# Patient Record
Sex: Male | Born: 2000 | Race: White | Hispanic: No | Marital: Single | Smoking: Never smoker
Health system: Southern US, Community
[De-identification: ages and names within clinical notes are randomized; demographics above are authoritative.]

## PROBLEM LIST (undated history)

## (undated) DIAGNOSIS — F32A Depression, unspecified: Secondary | ICD-10-CM

## (undated) DIAGNOSIS — F329 Major depressive disorder, single episode, unspecified: Secondary | ICD-10-CM

## (undated) DIAGNOSIS — F909 Attention-deficit hyperactivity disorder, unspecified type: Secondary | ICD-10-CM

## (undated) DIAGNOSIS — F988 Other specified behavioral and emotional disorders with onset usually occurring in childhood and adolescence: Secondary | ICD-10-CM

## (undated) DIAGNOSIS — R569 Unspecified convulsions: Secondary | ICD-10-CM

## (undated) HISTORY — DX: Depression, unspecified: F32.A

## (undated) HISTORY — DX: Major depressive disorder, single episode, unspecified: F32.9

## (undated) HISTORY — PX: APPENDECTOMY: SHX54

---
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
## 2019-01-10 ENCOUNTER — Telehealth: Payer: Self-pay | Admitting: Family Medicine

## 2019-01-10 NOTE — Telephone Encounter (Signed)
Tried to call pt to reschedule cancelled appt from 02/21/19 with Dr. Leretha Pol. Dr. Stann Mainland be out of the office that day.VM was full so no message was able to be left. No MyChart. When/If pt calls in, please reschedule with Dr. Leretha Pol at their convenience.

## 2019-02-21 ENCOUNTER — Ambulatory Visit: Payer: BLUE CROSS/BLUE SHIELD | Admitting: Family Medicine

## 2019-03-11 ENCOUNTER — Other Ambulatory Visit (INDEPENDENT_AMBULATORY_CARE_PROVIDER_SITE_OTHER): Payer: Self-pay | Admitting: Neurology

## 2019-03-16 ENCOUNTER — Telehealth (INDEPENDENT_AMBULATORY_CARE_PROVIDER_SITE_OTHER): Payer: Self-pay | Admitting: Neurology

## 2019-03-16 DIAGNOSIS — F329 Major depressive disorder, single episode, unspecified: Principal | ICD-10-CM

## 2019-03-16 DIAGNOSIS — R4589 Other symptoms and signs involving emotional state: Secondary | ICD-10-CM

## 2019-03-16 NOTE — Telephone Encounter (Signed)
°  Who's calling (name and relationship to patient) : Bonney Leitz - Father   Best contact number: [phone]   Provider they see: Dr. Devonne Doughty    Reason for call: Dad called stating that Alycia Rossetti needs a refill on the Lexapro. He has about one week left of his medication. Please advise     PRESCRIPTION REFILL ONLY  Name of prescription: Lexapro 20 MG   [facility]  [address]  [place], [place]

## 2019-03-16 NOTE — Telephone Encounter (Signed)
Please let father know that this prescription was not order by neurology, so I can not refill that.  Also he has not had any appointment since last year so he needs an appointment before refilling other meds.

## 2019-03-20 NOTE — Telephone Encounter (Signed)
Called patient's father and he confused seizure medication with lexapro. I scheduled Jeremiah Hart and appointment through WebEx tomorrow at 8:45am for a follow and medication refills.

## 2019-03-21 ENCOUNTER — Encounter (INDEPENDENT_AMBULATORY_CARE_PROVIDER_SITE_OTHER): Payer: Self-pay | Admitting: Neurology

## 2019-03-21 ENCOUNTER — Other Ambulatory Visit: Payer: Self-pay

## 2019-03-21 ENCOUNTER — Ambulatory Visit (INDEPENDENT_AMBULATORY_CARE_PROVIDER_SITE_OTHER): Payer: BLUE CROSS/BLUE SHIELD | Admitting: Neurology

## 2019-03-21 DIAGNOSIS — G40B09 Juvenile myoclonic epilepsy, not intractable, without status epilepticus: Secondary | ICD-10-CM | POA: Diagnosis not present

## 2019-03-21 DIAGNOSIS — R4589 Other symptoms and signs involving emotional state: Secondary | ICD-10-CM

## 2019-03-21 DIAGNOSIS — F329 Major depressive disorder, single episode, unspecified: Secondary | ICD-10-CM

## 2019-03-21 MED ORDER — DIVALPROEX SODIUM ER 500 MG PO TB24: 1000.0000 mg | ORAL_TABLET | Freq: Every day | ORAL | 6 refills | Status: DC

## 2019-03-21 NOTE — Progress Notes (Signed)
This is a Pediatric Specialist E-Visit follow up consult provided via WebEx Jeremiah Fabianyan Ellis Hart  consented to an E-Visit consult today.  Location of patient: Jeremiah Hart is at home Location of provider: Dr Devonne DoughtyNabizadeh is in office Patient was referred by Berline Lopes'Kelley, Brian, MD   The following participants were involved in this E-Visit:  Tresa EndoKelly, CMA Dr Angelia MouldNabizadeh Tu, patient Dad   Chief Complain/ Reason for E-Visit today: Seizures Total time on call: 25 minutes Follow up: 6 months  Patient: Jeremiah Hart MRN: [id number] Sex: male DOB: Oct 26, 2001  Provider: Keturah Shaverseza Jerric Oyen, MD [facility]  Note type: Routine return visit  Referral Source: Berline LopesBrian O'Kelley, MD [facility] and dad Chief Complaint: Seizures  History of Present Illness: Jeremiah Hart is a 18 y.o. male is here on WebEx for follow-up management of seizure disorder.  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 He was last seen in March 2019 and at that point he was recommended to slightly increase the dose of Depakote from 1000 mg to 1250 mg which he continued for a while but after that he continued with 1000 mg Depakote ER every night which is his current dose of medication. He has not had any clinical seizure activity for the past 2 to 3 years.  His last EEG was in April 2019 which was read by Dr. Sharene SkeansHickling and it was normal but initial EEG in 2017 was showing brief episodes of generalized discharges and also his prolonged ambulatory EEG was abnormal. He was also having some mood issues with depressed mood for which he was on SSRI for a while and currently he is on tapering dose of medication for the past few months. He was recommended to have some blood work done a few months ago but it has not been done in the last  blood work was from last year.  He usually sleeps well through the night and he has no other complaints or concerns at this time.  Review of Systems: 12 system review as per HPI, otherwise negative.  Past Medical History:  Diagnosis Date  . ADHD (attention deficit hyperactivity disorder)   . Attention deficit disorder (ADD)   . Depression   . Seizures (HCC)    Hospitalizations: No., Head Injury: No., Nervous System Infections: No., Immunizations up to date: Yes.     Surgical History Past Surgical History:  Procedure Laterality Date  . APPENDECTOMY    . APPENDECTOMY  10/09/2011   Procedure: APPENDECTOMY PEDIATRIC;  Surgeon: Judie PetitM. Leonia CoronaShuaib Farooqui, MD;  Location: [facility] OR;  Service: Pediatrics;  Laterality: N/A;    Family History family history includes Birth defects in his paternal grandfather; COPD in his paternal grandfather; Hypertension in his father and paternal grandfather; Migraines in his paternal aunt and sister; Vision loss in his paternal grandmother.   Social History Social History   Socioeconomic History  . Marital status: Single    Spouse name: Not on file  . Number of children: Not on file  . Years of education: Not on file  . Highest education level: Not on file  Occupational History  . Not on file  Social Needs  . Financial resource strain: Not on file  . Food insecurity:    Worry: Not on file    Inability: Not on file  . Transportation needs:  Medical: Not on file    Non-medical: Not on file  Tobacco Use  . Smoking status: Never Smoker  . Smokeless tobacco: Never Used  Substance and Sexual Activity  . Alcohol use: No  . Drug use: No  . Sexual activity: Never  Lifestyle  . Physical activity:    Days per week: Not on file    Minutes per session: Not on file  . Stress: Not on file  Relationships  . Social connections:    Talks on phone: Not on file    Gets together: Not on file    Attends religious service: Not on file    Active member of club  or organization: Not on file    Attends meetings of clubs or organizations: Not on file    Relationship status: Not on file  Other Topics Concern  . Not on file  Social History Narrative   Achillies works at [employer]. He lives at home with mom, dad and sister.      The medication list was reviewed and reconciled. All changes or newly prescribed medications were explained.  A complete medication list was provided to the patient/caregiver.  No Known Allergies  Physical Exam There were no vitals taken for this visit. His limited neurological exam on WebEx is normal.  He was awake and alert, follows instructions appropriately with normal comprehension and fluent speech.  He had normal cranial nerve exam.  He was able to walk normally without any coordination issues or difficulty with balance and with no tremor.  He had normal range of motion with no limitation of activity.  Assessment and Plan 1. Nonintractable juvenile myoclonic epilepsy without status epilepticus (HCC)   2. Depressed mood    This is a 18 year old male with diagnosis of generalized seizure disorder and possibly juvenile myoclonic epilepsy, currently on moderate dose of Depakote with no clinical seizure activity over the past 2 to 3 years and doing well otherwise.  He also has some depressed mood currently on low tapering dose of SSRI.  He has no findings on his limited neurological exam. Recommendations: Continue the same dose of Depakote at 1000 mg every night He needs to have some blood work including Depakote level over the next few weeks I will schedule him for an EEG with sleep deprivation to be done in August. I discussed with patient and his father that if he continues to be seizure-free for the next several months and his next EEG is normal, I may gradually taper and discontinue Depakote toward the end of the year. I would like to see him in 6 months for follow-up visit but I will call him with the EEG result and blood  work results.  He and his father understood and agreed with the plan.  Meds ordered this encounter  Medications  . divalproex (DEPAKOTE ER) 500 MG 24 hr tablet    Sig: Take 2 tablets (1,000 mg total) by mouth at bedtime.    Dispense:  60 tablet    Refill:  6   Orders Placed This Encounter  Procedures  . Valproic acid level  . CBC with Differential/Platelet  . Comprehensive metabolic panel  . VITAMIN D 25 Hydroxy (Vit-D Deficiency, Fractures)  . Child sleep deprived EEG    Standing Status:   Future    Standing Expiration Date:   03/20/2020

## 2019-03-21 NOTE — Patient Instructions (Signed)
Please continue the same dose of Depakote at 1000 mg every night You should have some blood work done over the next few weeks. I also schedule a sleep deprived EEG for August If you continue to be seizure-free for the next 6 months and next EEG is normal then we may consider gradually tapering and discontinuing your seizure medication toward the end of the year. Call at any time if there is any seizure activity. Please return in 6 months.

## 2019-04-04 ENCOUNTER — Other Ambulatory Visit: Payer: Self-pay

## 2019-04-04 ENCOUNTER — Ambulatory Visit (INDEPENDENT_AMBULATORY_CARE_PROVIDER_SITE_OTHER): Payer: BLUE CROSS/BLUE SHIELD | Admitting: Pediatrics

## 2019-04-04 ENCOUNTER — Telehealth (INDEPENDENT_AMBULATORY_CARE_PROVIDER_SITE_OTHER): Payer: Self-pay | Admitting: Neurology

## 2019-04-04 DIAGNOSIS — G40B09 Juvenile myoclonic epilepsy, not intractable, without status epilepticus: Secondary | ICD-10-CM

## 2019-04-04 NOTE — Procedures (Signed)
Patient:  Jeremiah Hart   Sex: male  DOB:  Jun 12, 2001  Date of study: 04/04/2019  Clinical history: This is a 18 year old male with diagnosis of generalized seizure disorder and possibly juvenile myoclonic epilepsy, currently on moderate dose of Depakote with no clinical seizure activity over the past 2 to 3 years. EEG was done to evaluate for epileptiform discharges.  Medication: Depakote  Procedure: The tracing was carried out on a 32 channel digital Cadwell recorder reformatted into 16 channel montages with 1 devoted to EKG.  The 10 /20 international system electrode placement was used. Recording was done during awake, drowsiness and sleep states. Recording time  44.5 Minutes.   Description of findings: Background rhythm consists of amplitude of 50  microvolt and frequency of 10hertz posterior dominant rhythm. There was normal anterior posterior gradient noted. Background was well organized, continuous and symmetric with no focal slowing. There was muscle artifact noted. During drowsiness and sleep there was gradual decrease in background frequency noted. During the early stages of sleep there were symmetrical sleep spindles and vertex sharp waves noted.  Hyperventilation resulted in slowing of the background activity. Photic simulation using stepwise increase in photic frequency resulted in bilateral symmetric driving response. Throughout the recording there were 2 brief episodes of generalized epileptiform activities in the form of spikes noted. One of the episodes was accompanied by a myoclonic jerk which woke him up from sleep. There were no transient rhythmic activities or electrographic seizures noted. One lead EKG rhythm strip revealed sinus rhythm at a rate of 80  bpm.  Impression: This EEG is slightly abnormal due to 2 brief discharges as described.  The findings consistent with generalized seizure disorder, associated with lower seizure threshold and require careful clinical  correlation.    Keturah Shavers, MD

## 2019-04-04 NOTE — Telephone Encounter (Signed)
Please call parents and let them know that the EEG is very slightly abnormal so we need to continue the same dose of medication for now. I will call them with the result of blood work.

## 2019-04-05 NOTE — Telephone Encounter (Signed)
Spoke to dad and informed him of the EEG results

## 2019-09-20 ENCOUNTER — Ambulatory Visit (INDEPENDENT_AMBULATORY_CARE_PROVIDER_SITE_OTHER): Payer: BLUE CROSS/BLUE SHIELD | Admitting: Neurology

## 2019-09-24 ENCOUNTER — Encounter (INDEPENDENT_AMBULATORY_CARE_PROVIDER_SITE_OTHER): Payer: Self-pay | Admitting: Neurology

## 2019-09-24 ENCOUNTER — Other Ambulatory Visit: Payer: Self-pay

## 2019-09-24 ENCOUNTER — Ambulatory Visit (INDEPENDENT_AMBULATORY_CARE_PROVIDER_SITE_OTHER): Payer: BLUE CROSS/BLUE SHIELD | Admitting: Neurology

## 2019-09-24 VITALS — BP 100/70 | HR 72 | Ht 63.98 in | Wt 147.4 lb

## 2019-09-24 DIAGNOSIS — G40B09 Juvenile myoclonic epilepsy, not intractable, without status epilepticus: Secondary | ICD-10-CM | POA: Diagnosis not present

## 2019-09-24 DIAGNOSIS — R569 Unspecified convulsions: Secondary | ICD-10-CM | POA: Diagnosis not present

## 2019-09-24 MED ORDER — DIVALPROEX SODIUM ER 500 MG PO TB24: 1000.0000 mg | ORAL_TABLET | Freq: Every day | ORAL | 6 refills | Status: DC

## 2019-09-24 NOTE — Progress Notes (Signed)
Patient: Jeremiah Hart MRN: [id number] Sex: male DOB: 2001/03/12  Provider: Keturah Shavers, MD Location of Care: [facility]  Note type: Routine return visit  Referral Source: Berline Lopes, MD History from: patient, [facility] chart and mom Chief Complaint: Seizures  History of Present Illness: Jeremiah Hart is a 18 y.o. male is here for follow-up management of seizure disorder.  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 Currently he is taking 1000 mg of Depakote ER with good seizure control and no clinical seizure activity over the past year.  He was last seen on 03/21/2019 and his last EEG was Apr 04, 2019 with just 2 brief episodes of generalized discharges. Over the past several months he has been doing well without having any clinical seizure activity as per patient and his mother.  He is usually sleeping well without any issues. Previously was taking SSRI for depressed mood but currently is not taking any other medication except for Depakote.  He was supposed to have some blood work done on his last visit which has not been done yet.  He and his mother do not have any other complaints or concerns at this time.  Review of Systems: Review of system as per HPI, otherwise negative.  Past Medical History:  Diagnosis Date  . ADHD (attention deficit hyperactivity disorder)   . Attention deficit disorder (ADD)   . Depression   . Seizures (HCC)    Hospitalizations: No., Head Injury: No., Nervous System Infections: No., Immunizations up to date: Yes.     Surgical History Past Surgical History:  Procedure Laterality Date  . APPENDECTOMY    . APPENDECTOMY  10/09/2011   Procedure: APPENDECTOMY PEDIATRIC;  Surgeon: Judie Petit. Leonia Corona, MD;  Location: [facility] OR;  Service: Pediatrics;  Laterality: N/A;    Family History family history includes  Birth defects in his paternal grandfather; COPD in his paternal grandfather; Hypertension in his father and paternal grandfather; Migraines in his paternal aunt and sister; Vision loss in his paternal grandmother.   Social History Social History   Socioeconomic History  . Marital status: Single    Spouse name: Not on file  . Number of children: Not on file  . Years of education: Not on file  . Highest education level: Not on file  Occupational History  . Not on file  Social Needs  . Financial resource strain: Not on file  . Food insecurity    Worry: Not on file    Inability: Not on file  . Transportation needs    Medical: Not on file    Non-medical: Not on file  Tobacco Use  . Smoking status: Never Smoker  . Smokeless tobacco: Never Used  Substance and Sexual Activity  . Alcohol use: No  . Drug use: No  . Sexual activity: Never  Lifestyle  . Physical activity    Days per week: Not on file    Minutes per session: Not on file  . Stress: Not on file  Relationships  . Social Musician on phone: Not on file    Gets together: Not on file    Attends religious service: Not on file    Active member of club or organization: Not on file    Attends meetings of clubs or organizations: Not on file    Relationship status: Not on file  Other Topics Concern  . Not on file  Social History Narrative   Thanh works at [employer]. He lives at home with mom, dad and sister.      No Known Allergies  Physical Exam BP 100/70   Pulse 72   Ht 5' 3.98" (1.625 m)   Wt 147 lb 6.4 oz (66.9 kg)   BMI 25.32 kg/m  Gen: Awake, alert, not in distress Skin: No rash, No neurocutaneous stigmata. HEENT: Normocephalic, no dysmorphic features, no conjunctival injection, nares patent, mucous membranes moist, oropharynx clear. Neck: Supple, no meningismus. No focal tenderness. Resp: Clear to auscultation bilaterally CV: Regular rate, normal S1/S2, no murmurs, no rubs Abd: BS present,  abdomen soft, non-tender, non-distended. No hepatosplenomegaly or mass Ext: Warm and well-perfused. No deformities, no muscle wasting, ROM full.  Neurological Examination: MS: Awake, alert, interactive. Normal eye contact, answered the questions appropriately, speech was fluent,  Normal comprehension.  Attention and concentration were normal. Cranial Nerves: Pupils were equal and reactive to light ( 5-82mm);  normal fundoscopic exam with sharp discs, visual field full with confrontation test; EOM normal, no nystagmus; no ptsosis, no double vision, intact facial sensation, face symmetric with full strength of facial muscles, hearing intact to finger rub bilaterally, palate elevation is symmetric, tongue protrusion is symmetric with full movement to both sides.  Sternocleidomastoid and trapezius are with normal strength. Tone-Normal Strength-Normal strength in all muscle groups DTRs-  Biceps Triceps Brachioradialis Patellar Ankle  R 2+ 2+ 2+ 2+ 2+  L 2+ 2+ 2+ 2+ 2+   Plantar responses flexor bilaterally, no clonus noted Sensation: Intact to light touch,  Romberg negative. Coordination: No dysmetria on FTN test. No difficulty with balance. Gait: Normal walk and run. Tandem gait was normal. Was able to perform toe walking and heel walking without difficulty.   Assessment and Plan 1. Nonintractable juvenile myoclonic epilepsy without status epilepticus ([place])    This is a 18 year old male with generalized seizure disorder and most likely juvenile myoclonic epilepsy, currently on fairly low to moderate dose of Depakote ER at 1000 mg every night, tolerating medication well with no side effects.  He has normal neurological exam with no recent clinical seizures. Recommend to continue the same dose of Depakote at 1000 mg every night. Recommend to perform blood work to check the Depakote level as well as CBC, CMP and TSH I do not think he needs follow-up EEG at this point but after his next visit I may  consider performing a routine EEG or a prolonged ambulatory EEG for evaluation of epileptiform discharges. I discussed with patient again regarding the seizure precautions and seizure triggers particularly adequate sleep and limited screen time. I would like to see him in 6 months for follow-up visit or sooner if he develops more seizure activity.  He and his mother understood and agreed with the plan.    Meds ordered this encounter  Medications  . divalproex (DEPAKOTE ER) 500 MG 24 hr tablet    Sig: Take 2 tablets (1,000 mg total) by mouth at bedtime.    Dispense:  60 tablet    Refill:  6   Orders Placed This Encounter  Procedures  . Valproic acid level  . CBC with Differential/Platelet  . Comprehensive metabolic panel  . TSH  . TSH  . Comprehensive metabolic panel  . CBC with Differential/Platelet  . Valproic acid level

## 2019-09-24 NOTE — Patient Instructions (Signed)
Continue the same dose of Depakote Perform blood work today Continue with adequate sleep and limited screen time Call my office if there is any seizure We will consider routine EEG or a prolonged overnight EEG after your next visit.   Return in 6 months for follow-up visit

## 2019-09-25 LAB — CBC WITH DIFFERENTIAL/PLATELET
Absolute Monocytes: 364 cells/uL (ref 200–950)
Basophils Absolute: 50 cells/uL (ref 0–200)
Basophils Relative: 0.9 %
Eosinophils Absolute: 11 cells/uL — ABNORMAL LOW (ref 15–500)
Eosinophils Relative: 0.2 %
HCT: 42.9 % (ref 38.5–50.0)
Hemoglobin: 14.8 g/dL (ref 13.2–17.1)
Lymphs Abs: 2307 cells/uL (ref 850–3900)
MCH: 30.3 pg (ref 27.0–33.0)
MCHC: 34.5 g/dL (ref 32.0–36.0)
MCV: 87.7 fL (ref 80.0–100.0)
MPV: 12.2 fL (ref 7.5–12.5)
Monocytes Relative: 6.5 %
Neutro Abs: 2867 cells/uL (ref 1500–7800)
Neutrophils Relative %: 51.2 %
Platelets: 195 10*3/uL (ref 140–400)
RBC: 4.89 10*6/uL (ref 4.20–5.80)
RDW: 12.5 % (ref 11.0–15.0)
Total Lymphocyte: 41.2 %
WBC: 5.6 10*3/uL (ref 3.8–10.8)

## 2019-09-25 LAB — COMPREHENSIVE METABOLIC PANEL
AG Ratio: 1.8 (calc) (ref 1.0–2.5)
ALT: 13 U/L (ref 8–46)
AST: 15 U/L (ref 12–32)
Albumin: 4.9 g/dL (ref 3.6–5.1)
Alkaline phosphatase (APISO): 84 U/L (ref 46–169)
BUN: 10 mg/dL (ref 7–20)
CO2: 23 mmol/L (ref 20–32)
Calcium: 9.9 mg/dL (ref 8.9–10.4)
Chloride: 106 mmol/L (ref 98–110)
Creat: 0.63 mg/dL (ref 0.60–1.26)
Globulin: 2.8 g/dL (calc) (ref 2.1–3.5)
Glucose, Bld: 78 mg/dL (ref 65–99)
Potassium: 4.6 mmol/L (ref 3.8–5.1)
Sodium: 141 mmol/L (ref 135–146)
Total Bilirubin: 0.3 mg/dL (ref 0.2–1.1)
Total Protein: 7.7 g/dL (ref 6.3–8.2)

## 2019-09-25 LAB — VALPROIC ACID LEVEL: Valproic Acid Lvl: 61 mg/L (ref 50.0–100.0)

## 2019-12-12 ENCOUNTER — Telehealth (INDEPENDENT_AMBULATORY_CARE_PROVIDER_SITE_OTHER): Payer: Self-pay | Admitting: Neurology

## 2019-12-12 NOTE — Telephone Encounter (Signed)
Dad came in to drop off insurance forms for Dr. Devonne Doughty to complete. Please call dad when done. Phone number [phone]

## 2020-01-16 DIAGNOSIS — H1031 Unspecified acute conjunctivitis, right eye: Secondary | ICD-10-CM | POA: Diagnosis not present

## 2020-01-16 DIAGNOSIS — H579 Unspecified disorder of eye and adnexa: Secondary | ICD-10-CM | POA: Diagnosis not present

## 2020-02-12 NOTE — Telephone Encounter (Signed)
No action needed

## 2020-06-14 ENCOUNTER — Other Ambulatory Visit (INDEPENDENT_AMBULATORY_CARE_PROVIDER_SITE_OTHER): Payer: Self-pay | Admitting: Neurology

## 2020-06-20 ENCOUNTER — Other Ambulatory Visit: Payer: Self-pay

## 2020-06-20 ENCOUNTER — Ambulatory Visit (INDEPENDENT_AMBULATORY_CARE_PROVIDER_SITE_OTHER): Payer: BC Managed Care – PPO | Admitting: Family Medicine

## 2020-06-20 ENCOUNTER — Encounter: Payer: Self-pay | Admitting: Family Medicine

## 2020-06-20 VITALS — BP 113/71 | HR 78 | Temp 98.1°F | Ht 63.98 in | Wt 138.6 lb

## 2020-06-20 DIAGNOSIS — R41840 Attention and concentration deficit: Secondary | ICD-10-CM

## 2020-06-20 DIAGNOSIS — G40B09 Juvenile myoclonic epilepsy, not intractable, without status epilepticus: Secondary | ICD-10-CM | POA: Diagnosis not present

## 2020-06-20 NOTE — Patient Instructions (Addendum)
[facility] [address] [place]  [phone]    If you have lab work done today you will be contacted with your lab results within the next 2 weeks.  If you have not heard from [place] then please contact us. The fastest way to get your results is to register for My Chart.   IF you received an x-ray today, you will receive an invoice from [facility]. Please contact [facility] at [phone] with questions or concerns regarding your invoice.   IF you received labwork today, you will receive an invoice from [facility]. Please contact [facility] at [phone] with questions or concerns regarding your invoice.   Our billing staff will not be able to assist you with questions regarding bills from these companies.  You will be contacted with the lab results as soon as they are available. The fastest way to get your results is to activate your My Chart account. Instructions are located on the last page of this paperwork. If you have not heard from [place] regarding the results in 2 weeks, please contact this office.

## 2020-06-20 NOTE — Progress Notes (Signed)
7/30/20214:02 PM  Jeremiah Hart 08/06/2001, 19 y.o., male [id number]  Chief Complaint  Patient presents with  . ADHD    after speaking with the therapist which is seen q 3-4 wks, told that it may be wise to get back on the concerta medication. Last tken in 2015 from 2009-2015. Has  taken the 17,27,36mg  of med tapering down to none.    HPI:   Patient is a 19 y.o. male with past medical history significant for depression, ADD, seziures who presents today wishing to restart ADD meds  Last OV Jan 2020 - weaning off lexapro  Has been working with therapist Lonell Face for about 6 months at [facility] Coping with increased anxiety secondary to untreated ADD Patient used to be on concerta in the past and did well on it Stopped around 2015 as felt he did not need it anymore Works full time at shop Continues to see peds neuro for seizures, last OV nov 2020, no changes, followup in 6 months  ASRS v1.1 done. Part A sometimes to Q1 and Q2., very often Q5 and Q6.   Depression screen PHQ 2/9 04/22/2017  Decreased Interest 2  Down, Depressed, Hopeless 3  PHQ - 2 Score 5  Altered sleeping 3  Tired, decreased energy 1  Change in appetite 0  Feeling bad or failure about yourself  1  Trouble concentrating 1  Moving slowly or fidgety/restless 0  Suicidal thoughts 0  PHQ-9 Score 11    Fall Risk  06/20/2020 11/27/2018  Falls in the past year? 0 0  Number falls in past yr: 0 -  Injury with Fall? 0 -     No Known Allergies  Prior to Admission medications   Medication Sig Start Date End Date Taking? Authorizing Provider  divalproex (DEPAKOTE ER) 500 MG 24 hr tablet TAKE 2 TABLETS (1,000 MG TOTAL) BY MOUTH AT BEDTIME. 06/16/20  Yes Keturah Shavers, MD    Past Medical History:  Diagnosis Date  . ADHD (attention deficit hyperactivity disorder)   . Attention deficit disorder (ADD)   . Depression   . Seizures (HCC)     Past Surgical History:  Procedure  Laterality Date  . APPENDECTOMY    . APPENDECTOMY  10/09/2011   Procedure: APPENDECTOMY PEDIATRIC;  Surgeon: Judie Petit. Leonia Corona, MD;  Location: [facility] OR;  Service: Pediatrics;  Laterality: N/A;    Social History   Tobacco Use  . Smoking status: Never Smoker  . Smokeless tobacco: Never Used  Substance Use Topics  . Alcohol use: No    Family History  Problem Relation Age of Onset  . Hypertension Father   . Vision loss Paternal Grandmother        Macular Degeneration  . Birth defects Paternal Grandfather        Missing some of fingers and toes  . COPD Paternal Grandfather   . Hypertension Paternal Grandfather   . Migraines Sister   . Migraines Paternal Aunt     ROS Per hpi  OBJECTIVE:  Today's Vitals   06/20/20 1538  BP: 113/71  Pulse: 78  Temp: 98.1 F (36.7 C)  SpO2: 97%  Weight: 138 lb 9.6 oz (62.9 kg)  Height: 5' 3.98" (1.625 m)   Body mass index is 23.81 kg/m.   Physical Exam Vitals and nursing note reviewed.  Constitutional:      Appearance: He is well-developed.  HENT:     Head: Normocephalic and atraumatic.  Eyes:  Conjunctiva/sclera: Conjunctivae normal.     Pupils: Pupils are equal, round, and reactive to light.  Cardiovascular:     Rate and Rhythm: Normal rate and regular rhythm.     Heart sounds: No murmur heard.  No friction rub. No gallop.   Pulmonary:     Effort: Pulmonary effort is normal.     Breath sounds: Normal breath sounds. No wheezing or rales.  Musculoskeletal:     Cervical back: Neck supple.     Right lower leg: No edema.     Left lower leg: No edema.  Skin:    General: Skin is warm and dry.  Neurological:     Mental Status: He is alert and oriented to person, place, and time.     No results found for this or any previous visit (from the past 24 hour(s)).  No results found.   ASSESSMENT and PLAN  1. Poor concentration - Ambulatory referral to Psychiatry - [facility] to help anxiety vs ADD,  (manage and treat if ADD)  2. Nonintractable juvenile myoclonic epilepsy without status epilepticus (HCC) Managed by peds endo  No follow-ups on file.    Myles Lipps, MD Primary Care at [facility] [address] [place], [place] [address] Ph.  [phone] Fax [phone]

## 2020-07-14 ENCOUNTER — Other Ambulatory Visit (INDEPENDENT_AMBULATORY_CARE_PROVIDER_SITE_OTHER): Payer: Self-pay | Admitting: Neurology

## 2020-07-26 ENCOUNTER — Other Ambulatory Visit (INDEPENDENT_AMBULATORY_CARE_PROVIDER_SITE_OTHER): Payer: Self-pay | Admitting: Neurology

## 2020-07-29 ENCOUNTER — Telehealth (INDEPENDENT_AMBULATORY_CARE_PROVIDER_SITE_OTHER): Payer: Self-pay | Admitting: Neurology

## 2020-07-29 NOTE — Telephone Encounter (Signed)
  Who's calling (name and relationship to patient) : Misty Stanley, mother (DPR on file)  Best contact number: [phone]  Provider they see: Nab  Reason for call:      PRESCRIPTION REFILL ONLY  Name of prescription: Depakote-patient is completely out. Does not have any for today.   Pharmacy: [facility]

## 2020-07-29 NOTE — Telephone Encounter (Signed)
Lvm for mom to let her know the rx had been sent in

## 2020-08-14 ENCOUNTER — Other Ambulatory Visit: Payer: Self-pay

## 2020-08-14 ENCOUNTER — Ambulatory Visit (INDEPENDENT_AMBULATORY_CARE_PROVIDER_SITE_OTHER): Payer: BC Managed Care – PPO | Admitting: Neurology

## 2020-08-14 ENCOUNTER — Encounter (INDEPENDENT_AMBULATORY_CARE_PROVIDER_SITE_OTHER): Payer: Self-pay | Admitting: Neurology

## 2020-08-14 VITALS — BP 110/72 | HR 70 | Ht 64.57 in | Wt 136.5 lb

## 2020-08-14 DIAGNOSIS — R4589 Other symptoms and signs involving emotional state: Secondary | ICD-10-CM | POA: Diagnosis not present

## 2020-08-14 DIAGNOSIS — G40B09 Juvenile myoclonic epilepsy, not intractable, without status epilepticus: Secondary | ICD-10-CM | POA: Diagnosis not present

## 2020-08-14 MED ORDER — DIVALPROEX SODIUM ER 500 MG PO TB24
1000.0000 mg | ORAL_TABLET | Freq: Every day | ORAL | 6 refills | Status: DC
Start: 2020-08-14 — End: 2020-09-17

## 2020-08-14 NOTE — Patient Instructions (Signed)
Continue the same dose of Depakote at 1000 mg nightly We will schedule for some blood work to be done today We will schedule for sleep deprived EEG for the next couple of weeks Continue with adequate sleep and limited screen time Return in 6 months for follow-up

## 2020-08-14 NOTE — Progress Notes (Signed)
Patient: Jeremiah Hart MRN: [id number] Sex: male DOB: 05-20-2001  Provider: Keturah Shavers, MD Location of Care: [facility]  Note type: Routine return visit  Referral Source: Koren Shiver, MD History from: patient, [facility] chart and mom Chief Complaint: epilepsy  History of Present Illness: Jeremiah Hart is a 19 y.o. male is here for follow-up management of seizure disorder. 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 He has been doing well without any seizure activity at least for more than 3 years and has been tolerating moderate dose of Depakote well with no side effects. He was having significant depressed mood and some behavioral issues for which he was on antidepressant medication for a while but currently is not on any medication. He also had history of ADHD for which he was on stimulant medication in the past but currently is not on any medication. Currently he is walking on a daily basis and he is doing well. He usually sleeps well without any difficulty. He drives car. He has been stable without any other complaints or concerns over the past several months and since his last visit in November 2020. His last EEG was in May 2020 with 2 brief generalized discharges. His last blood work was in 2019 with Depakote level of 79  Review of Systems: Review of system as per HPI, otherwise negative.  Past Medical History:  Diagnosis Date  . ADHD (attention deficit hyperactivity disorder)   . Attention deficit disorder (ADD)   . Depression   . Seizures (HCC)    Hospitalizations: No., Head Injury: No., Nervous System Infections: No., Immunizations up to date: Yes.     Surgical History Past Surgical History:  Procedure Laterality Date  . APPENDECTOMY    . APPENDECTOMY   10/09/2011   Procedure: APPENDECTOMY PEDIATRIC;  Surgeon: Judie Petit. Leonia Corona, MD;  Location: [facility] OR;  Service: Pediatrics;  Laterality: N/A;    Family History family history includes Birth defects in his paternal grandfather; COPD in his paternal grandfather; Hypertension in his father and paternal grandfather; Migraines in his paternal aunt and sister; Vision loss in his paternal grandmother.   Social History Social History   Socioeconomic History  . Marital status: Single    Spouse name: Not on file  . Number of children: Not on file  . Years of education: Not on file  . Highest education level: Not on file  Occupational History  . Not on file  Tobacco Use  . Smoking status: Never Smoker  . Smokeless tobacco: Never Used  Substance and Sexual Activity  . Alcohol use: No  . Drug use: No  . Sexual activity: Never  Other Topics Concern  . Not on file  Social History Narrative   Wlliam works at [employer]. He lives at home with mom, dad and sister.    Social Determinants of Health   Financial Resource Strain:   . Difficulty of Paying Living Expenses: Not on file  Food Insecurity:   . Worried About Programme researcher, broadcasting/film/video in the Last Year: Not on file  . Ran Out of Food in the Last Year: Not on file  Transportation Needs:   . Lack of Transportation (Medical): Not on file  . Lack of Transportation (Non-Medical): Not on file  Physical Activity:   . Days of Exercise  per Week: Not on file  . Minutes of Exercise per Session: Not on file  Stress:   . Feeling of Stress : Not on file  Social Connections:   . Frequency of Communication with Friends and Family: Not on file  . Frequency of Social Gatherings with Friends and Family: Not on file  . Attends Religious Services: Not on file  . Active Member of Clubs or Organizations: Not on file  . Attends Banker Meetings: Not on file  . Marital Status: Not on file     No Known Allergies  Physical Exam BP 110/72   Pulse  70   Ht 5' 4.57" (1.64 m)   Wt 136 lb 7.4 oz (61.9 kg)   BMI 23.01 kg/m  Gen: Awake, alert, not in distress Skin: No rash, No neurocutaneous stigmata. HEENT: Normocephalic, no dysmorphic features, no conjunctival injection, nares patent, mucous membranes moist, oropharynx clear. Neck: Supple, no meningismus. No focal tenderness. Resp: Clear to auscultation bilaterally CV: Regular rate, normal S1/S2, no murmurs, no rubs Abd: BS present, abdomen soft, non-tender, non-distended. No hepatosplenomegaly or mass Ext: Warm and well-perfused. No deformities, no muscle wasting, ROM full.  Neurological Examination: MS: Awake, alert, interactive. Normal eye contact, answered the questions appropriately, speech was fluent,  Normal comprehension.  Attention and concentration were normal. Cranial Nerves: Pupils were equal and reactive to light ( 5-63mm);  normal fundoscopic exam with sharp discs, visual field full with confrontation test; EOM normal, no nystagmus; no ptsosis, no double vision, intact facial sensation, face symmetric with full strength of facial muscles, hearing intact to finger rub bilaterally, palate elevation is symmetric, tongue protrusion is symmetric with full movement to both sides.  Sternocleidomastoid and trapezius are with normal strength. Tone-Normal Strength-Normal strength in all muscle groups DTRs-  Biceps Triceps Brachioradialis Patellar Ankle  R 2+ 2+ 2+ 2+ 2+  L 2+ 2+ 2+ 2+ 2+   Plantar responses flexor bilaterally, no clonus noted Sensation: Intact to light touch, temperature, vibration, Romberg negative. Coordination: No dysmetria on FTN test. No difficulty with balance. Gait: Normal walk and run. Tandem gait was normal. Was able to perform toe walking and heel walking without difficulty.  Assessment and Plan 1. Nonintractable juvenile myoclonic epilepsy without status epilepticus (HCC)   2. Depressed mood    This is a 19 year old male with diagnosis of juvenile  myoclonic epilepsy since 2017 currently on moderate dose of Depakote with good seizure control and no clinical seizure activity for more than 3 years although his last EEG in 2020 showed brief generalized discharges. He has no focal findings on his neurological examination and has no other issues and has not been on any other medication. Recommendations: Continue the same dose of Depakote at 1000 mg daily Schedule for a sleep deprived EEG We will perform blood work to check Depakote level as well as CBC and CMP If his next EEG is normal and he continues to be seizure-free then we may try gradually taper and discontinue medication toward the end of next year. He will continue with adequate sleep and limited screen time. Return in 6 months for follow-up visit or sooner if he develops seizure activity. He and his mother understood and agreed with the plan.  Meds ordered this encounter  Medications  . divalproex (DEPAKOTE ER) 500 MG 24 hr tablet    Sig: Take 2 tablets (1,000 mg total) by mouth at bedtime.    Dispense:  60 tablet    Refill:  6  Orders Placed This Encounter  Procedures  . CBC with Differential/Platelet  . Comprehensive metabolic panel  . Valproic acid level  . Child sleep deprived EEG    Standing Status:   Future    Standing Expiration Date:   08/14/2021

## 2020-08-15 ENCOUNTER — Telehealth (INDEPENDENT_AMBULATORY_CARE_PROVIDER_SITE_OTHER): Payer: Self-pay | Admitting: Neurology

## 2020-08-15 LAB — CBC WITH DIFFERENTIAL/PLATELET
Absolute Monocytes: 371 cells/uL (ref 200–950)
Basophils Absolute: 63 cells/uL (ref 0–200)
Basophils Relative: 1.1 %
Eosinophils Absolute: 68 cells/uL (ref 15–500)
Eosinophils Relative: 1.2 %
HCT: 46.6 % (ref 38.5–50.0)
Hemoglobin: 15.9 g/dL (ref 13.2–17.1)
Lymphs Abs: 2793 cells/uL (ref 850–3900)
MCH: 30.8 pg (ref 27.0–33.0)
MCHC: 34.1 g/dL (ref 32.0–36.0)
MCV: 90.3 fL (ref 80.0–100.0)
MPV: 12 fL (ref 7.5–12.5)
Monocytes Relative: 6.5 %
Neutro Abs: 2405 cells/uL (ref 1500–7800)
Neutrophils Relative %: 42.2 %
Platelets: 215 10*3/uL (ref 140–400)
RBC: 5.16 10*6/uL (ref 4.20–5.80)
RDW: 12.2 % (ref 11.0–15.0)
Total Lymphocyte: 49 %
WBC: 5.7 10*3/uL (ref 3.8–10.8)

## 2020-08-15 LAB — COMPREHENSIVE METABOLIC PANEL
AG Ratio: 1.6 (calc) (ref 1.0–2.5)
ALT: 12 U/L (ref 9–46)
AST: 13 U/L (ref 10–40)
Albumin: 4.8 g/dL (ref 3.6–5.1)
Alkaline phosphatase (APISO): 68 U/L (ref 36–130)
BUN: 13 mg/dL (ref 7–25)
CO2: 29 mmol/L (ref 20–32)
Calcium: 10.1 mg/dL (ref 8.6–10.3)
Chloride: 102 mmol/L (ref 98–110)
Creat: 0.72 mg/dL (ref 0.60–1.35)
Globulin: 3 g/dL (calc) (ref 1.9–3.7)
Glucose, Bld: 78 mg/dL (ref 65–99)
Potassium: 5 mmol/L (ref 3.5–5.3)
Sodium: 138 mmol/L (ref 135–146)
Total Bilirubin: 0.4 mg/dL (ref 0.2–1.2)
Total Protein: 7.8 g/dL (ref 6.1–8.1)

## 2020-08-15 LAB — VALPROIC ACID LEVEL: Valproic Acid Lvl: 87.9 mg/L (ref 50.0–100.0)

## 2020-08-15 NOTE — Telephone Encounter (Signed)
Spoke to Burnham and let him know about the blood work

## 2020-08-15 NOTE — Telephone Encounter (Signed)
I reviewed the blood work with normal CBC and CMP and Depakote level of 87.  Tresa Endo, Please call mother and let her know that the blood works were normal and he needs to continue medication as it is for now.

## 2020-09-05 ENCOUNTER — Other Ambulatory Visit (INDEPENDENT_AMBULATORY_CARE_PROVIDER_SITE_OTHER): Payer: BC Managed Care – PPO

## 2020-09-08 DIAGNOSIS — R41840 Attention and concentration deficit: Secondary | ICD-10-CM | POA: Diagnosis not present

## 2020-09-16 ENCOUNTER — Other Ambulatory Visit (INDEPENDENT_AMBULATORY_CARE_PROVIDER_SITE_OTHER): Payer: Self-pay | Admitting: Neurology

## 2020-09-16 ENCOUNTER — Other Ambulatory Visit (INDEPENDENT_AMBULATORY_CARE_PROVIDER_SITE_OTHER): Payer: BC Managed Care – PPO

## 2020-09-16 NOTE — Telephone Encounter (Signed)
Approval for 90 day rx 

## 2020-09-30 ENCOUNTER — Other Ambulatory Visit (INDEPENDENT_AMBULATORY_CARE_PROVIDER_SITE_OTHER): Payer: BC Managed Care – PPO

## 2020-10-28 ENCOUNTER — Other Ambulatory Visit: Payer: Self-pay

## 2020-10-28 ENCOUNTER — Ambulatory Visit (INDEPENDENT_AMBULATORY_CARE_PROVIDER_SITE_OTHER): Payer: BC Managed Care – PPO | Admitting: Neurology

## 2020-10-28 DIAGNOSIS — G40B09 Juvenile myoclonic epilepsy, not intractable, without status epilepticus: Secondary | ICD-10-CM | POA: Diagnosis not present

## 2020-10-28 NOTE — Progress Notes (Signed)
Sleep deprived EEG completed; result pending.

## 2020-10-29 NOTE — Procedures (Signed)
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  This is a follow-up EEG for evaluation of epileptiform discharges.  Medication: Depakote             Procedure: The tracing was carried out on a 32 channel digital Cadwell recorder reformatted into 16 channel montages with 1 devoted to EKG.  The 10 /20 international system electrode placement was used. Recording was done during awake, drowsiness and sleep states. Recording time 43 minutes.   Description of findings: Background rhythm consists of amplitude of 50 microvolt and frequency of 10-11 hertz posterior dominant rhythm. There was normal anterior posterior gradient noted. Background was well organized, continuous and symmetric with no focal slowing. There was muscle artifact noted. During drowsiness and sleep there was gradual decrease in background frequency noted. During the early stages of sleep there were symmetrical sleep spindles and vertex sharp waves as well as occasional K complexes noted.  Hyperventilation resulted in slowing of the background activity. Photic stimulation using stepwise increase in photic frequency resulted in bilateral symmetric driving response. Throughout the recording there were just a few brief generalized discharges in the form of spikes and sharps noted toward the end of the recording.  There were no transient rhythmic activities or electrographic seizures noted. One lead EKG rhythm strip revealed sinus rhythm at a rate of 60 bpm.  Impression: This EEG is slightly abnormal due to a few brief generalized discharges as described. The findings are consistent with generalized seizure disorder, associated with lower seizure threshold and require careful clinical  correlation.    Keturah Shavers, MD

## 2020-11-07 ENCOUNTER — Telehealth (INDEPENDENT_AMBULATORY_CARE_PROVIDER_SITE_OTHER): Payer: Self-pay | Admitting: Neurology

## 2020-11-07 NOTE — Telephone Encounter (Signed)
I called patient and told him that the EEG shows a slight abnormality with occasional brief generalized discharges.  He will continue the same dose of seizure medication and I will see him in a few months at his next visit.

## 2020-11-07 NOTE — Telephone Encounter (Signed)
Please advise 

## 2020-11-07 NOTE — Telephone Encounter (Signed)
  Who's calling (name and relationship to patient) : Lanora Manis (mom - on DPR)  Best contact number: [phone]  Provider they see: Dr. Devonne Doughty  Reason for call: Requests call back with results of recent EEG.    PRESCRIPTION REFILL ONLY  Name of prescription:  Pharmacy:

## 2021-02-11 ENCOUNTER — Encounter (INDEPENDENT_AMBULATORY_CARE_PROVIDER_SITE_OTHER): Payer: Self-pay | Admitting: Neurology

## 2021-02-11 ENCOUNTER — Other Ambulatory Visit: Payer: Self-pay

## 2021-02-11 ENCOUNTER — Ambulatory Visit (INDEPENDENT_AMBULATORY_CARE_PROVIDER_SITE_OTHER): Payer: BC Managed Care – PPO | Admitting: Neurology

## 2021-02-11 VITALS — BP 110/76 | HR 74 | Ht 64.37 in | Wt 135.6 lb

## 2021-02-11 DIAGNOSIS — R4589 Other symptoms and signs involving emotional state: Secondary | ICD-10-CM | POA: Diagnosis not present

## 2021-02-11 DIAGNOSIS — G40B09 Juvenile myoclonic epilepsy, not intractable, without status epilepticus: Secondary | ICD-10-CM

## 2021-02-11 MED ORDER — DIVALPROEX SODIUM ER 500 MG PO TB24
1000.0000 mg | ORAL_TABLET | Freq: Every day | ORAL | 2 refills | Status: DC
Start: 2021-02-11 — End: 2021-12-04

## 2021-02-11 NOTE — Patient Instructions (Signed)
Continue the same dose of Depakote for now Will schedule for EEG with the next visit.  Return in 6 months for a FU visit

## 2021-02-11 NOTE — Progress Notes (Signed)
Patient: Jeremiah Hart MRN: [id number] Sex: male DOB: 10/05/2001  Provider: Keturah Shavers, MD Location of Care: [facility]  Note type: Routine return visit  Referral Source: Silvano Bilis, M History from: patient, [facility] chart and mom Chief Complaint: epilepsy  History of Present Illness: Jeremiah Hart is a 20 y.o. male is here for follow-up management of seizure disorder.  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 He has been on fairly low to moderate dose of Depakote over the past few years with no clinical seizure activity since 2018 and has been tolerating medication well with no side effects. His last EEGs in May 2020 and in December 2021 were slightly abnormal with occasional brief generalized discharges. He usually sleeps well without any difficulty and with no awakening.  He is doing significantly better in terms of mood and behavior.  Currently is not taking any other medication for anxiety and depression. Overall he thinks that he is doing well and he and his mother do not have any other complaints or concerns at this time.  Review of Systems: Review of system as per HPI, otherwise negative.  Past Medical History:  Diagnosis Date  . ADHD (attention deficit hyperactivity disorder)   . Attention deficit disorder (ADD)   . Depression   . Seizures (HCC)    Hospitalizations: No., Head Injury: No., Nervous System Infections: No., Immunizations up to date: Yes.    Surgical History Past Surgical History:  Procedure Laterality Date  . APPENDECTOMY    . APPENDECTOMY  10/09/2011   Procedure: APPENDECTOMY PEDIATRIC;  Surgeon: Judie Petit. Leonia Corona, MD;  Location: [facility] OR;  Service: Pediatrics;  Laterality: N/A;    Family History family history includes Birth defects in his paternal grandfather; COPD  in his paternal grandfather; Hypertension in his father and paternal grandfather; Migraines in his paternal aunt and sister; Vision loss in his paternal grandmother.   Social History Social History   Socioeconomic History  . Marital status: Single    Spouse name: Not on file  . Number of children: Not on file  . Years of education: Not on file  . Highest education level: Not on file  Occupational History  . Not on file  Tobacco Use  . Smoking status: Never Smoker  . Smokeless tobacco: Never Used  Substance and Sexual Activity  . Alcohol use: No  . Drug use: No  . Sexual activity: Never  Other Topics Concern  . Not on file  Social History Narrative   Usama works at [employer]. He lives at home with mom, dad and sister.    Social Determinants of Health   Financial Resource Strain: Not on file  Food Insecurity: Not on file  Transportation Needs: Not on file  Physical Activity: Not on file  Stress: Not on file  Social Connections: Not on file     No Known Allergies  Physical Exam BP 110/76   Pulse 74   Ht 5' 4.37" (1.635 m)   Wt 135 lb 9.3 oz (61.5 kg)   BMI 23.01 kg/m  Gen: Awake, alert, not in distress Skin: No rash, No neurocutaneous stigmata. HEENT: Normocephalic, no dysmorphic features, no conjunctival injection, nares patent, mucous membranes moist, oropharynx clear. Neck: Supple, no meningismus. No focal tenderness. Resp: Clear to auscultation bilaterally CV: Regular rate, normal S1/S2, no murmurs, no  rubs Abd: BS present, abdomen soft, non-tender, non-distended. No hepatosplenomegaly or mass Ext: Warm and well-perfused. No deformities, no muscle wasting, ROM full.  Neurological Examination: MS: Awake, alert, interactive. Normal eye contact, answered the questions appropriately, speech was fluent,  Normal comprehension.  Attention and concentration were normal. Cranial Nerves: Pupils were equal and reactive to light ( 5-67mm);  normal fundoscopic exam with  sharp discs, visual field full with confrontation test; EOM normal, no nystagmus; no ptsosis, no double vision, intact facial sensation, face symmetric with full strength of facial muscles, hearing intact to finger rub bilaterally, palate elevation is symmetric, tongue protrusion is symmetric with full movement to both sides.  Sternocleidomastoid and trapezius are with normal strength. Tone-Normal Strength-Normal strength in all muscle groups DTRs-  Biceps Triceps Brachioradialis Patellar Ankle  R 2+ 2+ 2+ 2+ 2+  L 2+ 2+ 2+ 2+ 2+   Plantar responses flexor bilaterally, no clonus noted Sensation: Intact to light touch, temperature, vibration, Romberg negative. Coordination: No dysmetria on FTN test. No difficulty with balance. Gait: Normal walk and run. Tandem gait was normal. Was able to perform toe walking and heel walking without difficulty.   Assessment and Plan 1. Nonintractable juvenile myoclonic epilepsy without status epilepticus (HCC)   2. Depressed mood    This is an almost 20 year old male with diagnosis of juvenile myoclonic epilepsy since 2017, currently on Depakote with good seizure control and no clinical seizure activity over the past 4 years although his EEG is still showing occasional brief generalized discharges.  He has no focal findings on his neurological examination at this time. I discussed with patient and his mother that there is genetic testing and epilepsy panel available that could be done to evaluate if there would be any genetic basis for his seizure and he agreed to do that. I also recommend to schedule for a follow-up EEG with his next appointment in about 6 months. I will continue the same dose of Depakote at 1000 mg daily every night He needs to continue with adequate sleep and limiting screen time and avoid bright sunlight. I would like to see him in 6 months for follow-up visit or sooner if he develops any seizure activity.  He and his mother understood and  agreed with the plan.  Meds ordered this encounter  Medications  . divalproex (DEPAKOTE ER) 500 MG 24 hr tablet    Sig: Take 2 tablets (1,000 mg total) by mouth at bedtime.    Dispense:  180 tablet    Refill:  2   Orders Placed This Encounter  Procedures  . Miscellaneous Genetic Test    Epilepsy panel  . Child sleep deprived EEG    Standing Status:   Future    Standing Expiration Date:   02/11/2022    Scheduling Instructions:     To be done in 6 months at the same time with the next visit.

## 2021-03-26 ENCOUNTER — Encounter (INDEPENDENT_AMBULATORY_CARE_PROVIDER_SITE_OTHER): Payer: Self-pay

## 2021-07-24 ENCOUNTER — Ambulatory Visit (INDEPENDENT_AMBULATORY_CARE_PROVIDER_SITE_OTHER): Payer: BC Managed Care – PPO | Admitting: Neurology

## 2021-07-24 ENCOUNTER — Other Ambulatory Visit: Payer: Self-pay

## 2021-07-24 DIAGNOSIS — G40B09 Juvenile myoclonic epilepsy, not intractable, without status epilepticus: Secondary | ICD-10-CM

## 2021-07-24 NOTE — Progress Notes (Signed)
OP adult sleep deprived EEG completed at [facility] office, results pending.

## 2021-07-29 NOTE — Procedures (Signed)
Patient:  Jeremiah Hart   Sex: male  DOB:  [birth year]  Date of study: 07/24/2021                Clinical history: This is a 20 year old male with diagnosis of juvenile myoclonic epilepsy since 2017 with no clinical seizure over the past 4 years.  His last EEG in 2020 was slightly abnormal with brief generalized discharges.  This is a follow-up EEG for evaluation of epileptiform discharges.  Medication:     Depakote           Procedure: The tracing was carried out on a 32 channel digital Cadwell recorder reformatted into 16 channel montages with 1 devoted to EKG.  The 10 /20 international system electrode placement was used. Recording was done during awake, drowsiness and sleep states. Recording time 43 minutes.   Description of findings: Background rhythm consists of amplitude of 45 microvolt and frequency of 10 hertz posterior dominant rhythm. There was normal anterior posterior gradient noted. Background was well organized, continuous and symmetric with no focal slowing. There was muscle artifact noted. During drowsiness and sleep there was gradual decrease in background frequency noted. During the early stages of sleep there were symmetrical sleep spindles and vertex sharp waves noted.  Hyperventilation resulted in slowing of the background activity. Photic stimulation using stepwise increase in photic frequency resulted in bilateral symmetric driving response. Throughout the recording there were occasional single sharply contoured waves noted in the frontal or central area or occasionally more generalized.  There were no transient rhythmic activities or electrographic seizures noted. One lead EKG rhythm strip revealed sinus rhythm at a rate of 65 bpm.  Impression: This EEG is slightly abnormal due to occasional sharply contoured waves as described. The findings are consistent with mild cortical irritability and slight increased epileptic potential, associated with lower seizure threshold and  require careful clinical correlation.    Keturah Shavers, MD

## 2021-12-04 ENCOUNTER — Other Ambulatory Visit (INDEPENDENT_AMBULATORY_CARE_PROVIDER_SITE_OTHER): Payer: Self-pay | Admitting: Neurology

## 2021-12-11 ENCOUNTER — Other Ambulatory Visit: Payer: Self-pay

## 2021-12-11 ENCOUNTER — Encounter (INDEPENDENT_AMBULATORY_CARE_PROVIDER_SITE_OTHER): Payer: Self-pay | Admitting: Neurology

## 2021-12-11 ENCOUNTER — Ambulatory Visit (INDEPENDENT_AMBULATORY_CARE_PROVIDER_SITE_OTHER): Payer: BC Managed Care – PPO | Admitting: Neurology

## 2021-12-11 VITALS — BP 112/80 | HR 68 | Ht 64.57 in | Wt 137.8 lb

## 2021-12-11 DIAGNOSIS — G40B09 Juvenile myoclonic epilepsy, not intractable, without status epilepticus: Secondary | ICD-10-CM | POA: Diagnosis not present

## 2021-12-11 DIAGNOSIS — R4589 Other symptoms and signs involving emotional state: Secondary | ICD-10-CM | POA: Diagnosis not present

## 2021-12-11 MED ORDER — DIVALPROEX SODIUM ER 500 MG PO TB24: 1000.0000 mg | ORAL_TABLET | Freq: Every day | ORAL | 10 refills | Status: DC

## 2021-12-11 NOTE — Patient Instructions (Signed)
Continue the same dose of Depakote at 1000 mg nightly Continue with adequate sleep and limiting screen time Call my office if there is any seizure activity We will schedule for a follow-up EEG at the same time with the next appointment We will also schedule for blood work to be done over the next couple of months Return in 10 months for follow-up visit

## 2021-12-11 NOTE — Progress Notes (Signed)
Patient: Jeremiah Hart MRN: [id number] Sex: male DOB: [birth year]  Provider: Keturah Shavers, MD Location of Care: [facility]  Note type: Routine return visit  Referral Source: Silvano Bilis, MD History from: mother, patient, and [facility] chart Chief Complaint: Follow Up Epilepsy  History of Present Illness: Jeremiah Hart is a 21 y.o. male is here for follow-up management of seizure disorder.  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 He has not had any clinical seizure activity since 2018 but his EEG is a still showing occasional brief generalized discharges.  His last EEG which was done in September 2022 showed occasional sharply contoured waves, some generalized and some in frontal and central area. He was last seen in March 2022 and since then he has been doing very well without having any clinical seizure activity and has been taking his medication regularly without any missing doses.  Currently he is taking Depakote 1000 mg every night.  He has not had any blood work for more than a year. He has been seen by his primary care physician and recently started on Lexapro to help with his mood issues.    Review of Systems: Review of system as per HPI, otherwise negative.  Past Medical History:  Diagnosis Date   ADHD (attention deficit hyperactivity disorder)    Attention deficit disorder (ADD)    Depression    Seizures (HCC)    Hospitalizations: No., Head Injury: No., Nervous System Infections: No., Immunizations up to date: Yes.     Surgical History Past Surgical History:  Procedure Laterality Date   APPENDECTOMY     APPENDECTOMY  10/09/2011   Procedure: APPENDECTOMY PEDIATRIC;  Surgeon: Judie Petit. Leonia Corona, MD;  Location: [facility];  Service: Pediatrics;  Laterality: N/A;    Family History family history  includes Birth defects in his paternal grandfather; COPD in his paternal grandfather; Hypertension in his father and paternal grandfather; Migraines in his paternal aunt and sister; Vision loss in his paternal grandmother.  Social History Social History   Socioeconomic History   Marital status: Single    Spouse name: Not on file   Number of children: Not on file   Years of education: Not on file   Highest education level: Not on file  Occupational History   Not on file  Tobacco Use   Smoking status: Never    Passive exposure: Never   Smokeless tobacco: Never  Substance and Sexual Activity   Alcohol use: No   Drug use: No   Sexual activity: Never  Other Topics Concern   Not on file  Social History Narrative   Jatavian works at [employer]. He lives at home with mom, dad and sister.    Social Determinants of Health   Financial Resource Strain: Not on file  Food Insecurity: Not on file  Transportation Needs: Not on file  Physical Activity: Not on file  Stress: Not on file  Social Connections: Not on file     No Known Allergies  Physical Exam BP 112/80    Pulse 68    Ht 5' 4.57" (1.64 m)    Wt 137 lb 12.6 oz (62.5 kg)    HC 22.05" (56 cm)    BMI 23.24 kg/m  Gen: Awake, alert, not in distress Skin: No rash, No neurocutaneous stigmata. HEENT: Normocephalic, no dysmorphic features,  no conjunctival injection, nares patent, mucous membranes moist, oropharynx clear. Neck: Supple, no meningismus. No focal tenderness. Resp: Clear to auscultation bilaterally CV: Regular rate, normal S1/S2, no murmurs, no rubs Abd: BS present, abdomen soft, non-tender, non-distended. No hepatosplenomegaly or mass Ext: Warm and well-perfused. No deformities, no muscle wasting, ROM full.  Neurological Examination: MS: Awake, alert, interactive. Normal eye contact, answered the questions appropriately, speech was fluent,  Normal comprehension.  Attention and concentration were normal. Cranial  Nerves: Pupils were equal and reactive to light ( 5-24mm);  normal fundoscopic exam with sharp discs, visual field full with confrontation test; EOM normal, no nystagmus; no ptsosis, no double vision, intact facial sensation, face symmetric with full strength of facial muscles, hearing intact to finger rub bilaterally, palate elevation is symmetric, tongue protrusion is symmetric with full movement to both sides.  Sternocleidomastoid and trapezius are with normal strength. Tone-Normal Strength-Normal strength in all muscle groups DTRs-  Biceps Triceps Brachioradialis Patellar Ankle  R 2+ 2+ 2+ 2+ 2+  L 2+ 2+ 2+ 2+ 2+   Plantar responses flexor bilaterally, no clonus noted Sensation: Intact to light touch, temperature, vibration, Romberg negative. Coordination: No dysmetria on FTN test. No difficulty with balance. Gait: Normal walk and run. Tandem gait was normal. Was able to perform toe walking and heel walking without difficulty.   Assessment and Plan 1. Nonintractable juvenile myoclonic epilepsy without status epilepticus (HCC)   2. 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  He has no focal findings on his neurological examination.  He does have some depression for which he is on Lexapro and doing better.  His last EEG a few months ago is still showing some abnormality. Recommend to continue the same dose of Depakote at 1000 mg daily We will schedule for blood work to check the trough level of Depakote. He will continue with adequate sleep and limiting screen time He needs to use sunglasses during driving to avoid bright sunlight We will schedule for a follow-up sleep deprived EEG at the same time with the next appointment He or his parents will call my office  at any time if there are more seizure activity We again discussed regarding further work-up such as genetic testing or brain MRI but most likely they would not give [place] any findings to change our treatment plan. I would like to see him in 10 months for follow-up visit and based on his clinical response and EEG may adjust the dose of medication.  He and his mother understood and agreed with the plan.   Meds ordered this encounter  Medications   divalproex (DEPAKOTE ER) 500 MG 24 hr tablet    Sig: Take 2 tablets (1,000 mg total) by mouth at bedtime.    Dispense:  60 tablet    Refill:  10   Orders Placed This Encounter  Procedures   Valproic acid level   CBC with Differential/Platelet   Comprehensive metabolic panel   Amylase   Child sleep deprived EEG    Standing Status:   Future    Standing Expiration Date:   12/11/2022    Scheduling Instructions:     Schedule EEG at the same time with the next appointment in 10 months    Order Specific Question:   Where  should this test be performed?    Answer:   [facility]

## 2022-12-28 ENCOUNTER — Other Ambulatory Visit (INDEPENDENT_AMBULATORY_CARE_PROVIDER_SITE_OTHER): Payer: Self-pay | Admitting: Neurology

## 2022-12-29 NOTE — Telephone Encounter (Signed)
Last Encounter: 12-12-2031 with Plan to return in 42mos (with labs and EEG). No appointment since.  No upcoming appt.  Last Rx: 12-11-2021  B. Roten CMA

## 2023-01-20 ENCOUNTER — Other Ambulatory Visit (INDEPENDENT_AMBULATORY_CARE_PROVIDER_SITE_OTHER): Payer: Self-pay | Admitting: Neurology

## 2023-08-30 ENCOUNTER — Other Ambulatory Visit (INDEPENDENT_AMBULATORY_CARE_PROVIDER_SITE_OTHER): Payer: Self-pay | Admitting: Neurology

## 2023-09-07 ENCOUNTER — Other Ambulatory Visit (INDEPENDENT_AMBULATORY_CARE_PROVIDER_SITE_OTHER): Payer: Self-pay

## 2023-09-07 DIAGNOSIS — R569 Unspecified convulsions: Secondary | ICD-10-CM

## 2023-09-22 ENCOUNTER — Telehealth (INDEPENDENT_AMBULATORY_CARE_PROVIDER_SITE_OTHER): Payer: Self-pay | Admitting: Neurology

## 2023-09-22 NOTE — Telephone Encounter (Signed)
Who's calling (name and relationship to patient) : Jeremiah Hart; mom   Best contact number: [phone]  Provider they see: Dr. Merri Brunette  Reason for call: Mom has called in wanting to know if lab work would need to be done. She stated that its been 2 yrs since he had lab work done and has been on the same medication. She also wanted to know if Jeremiah Hart would still be seen after he turns 23 or if would need adult care. She stated [name] can be contacted at [phone]  FYI: no DPR on file.    Call ID:      PRESCRIPTION REFILL ONLY  Name of prescription:  Pharmacy:

## 2023-11-08 ENCOUNTER — Ambulatory Visit (INDEPENDENT_AMBULATORY_CARE_PROVIDER_SITE_OTHER): Payer: BC Managed Care – PPO | Admitting: Neurology

## 2023-11-08 ENCOUNTER — Encounter (INDEPENDENT_AMBULATORY_CARE_PROVIDER_SITE_OTHER): Payer: Self-pay | Admitting: Neurology

## 2023-11-08 VITALS — BP 112/60 | HR 64 | Ht 64.33 in | Wt 154.5 lb

## 2023-11-08 DIAGNOSIS — R569 Unspecified convulsions: Secondary | ICD-10-CM

## 2023-11-08 DIAGNOSIS — G40B09 Juvenile myoclonic epilepsy, not intractable, without status epilepticus: Secondary | ICD-10-CM

## 2023-11-08 DIAGNOSIS — R4589 Other symptoms and signs involving emotional state: Secondary | ICD-10-CM

## 2023-11-08 MED ORDER — DIVALPROEX SODIUM ER 500 MG PO TB24: 1000.0000 mg | ORAL_TABLET | Freq: Every day | ORAL | 2 refills | Status: DC

## 2023-11-08 NOTE — Procedures (Signed)
Patient:  Jeremiah Hart   Sex: male  DOB:  02/14/2001  Date of study:    11/08/2023              Clinical history: This is a 22 year old male with diagnosis of generalized seizure disorder and most likely JME, on medication with good seizure control.  This is a follow-up EEG for evaluation of epileptiform discharges.  Medication:   Depakote            Procedure: The tracing was carried out on a 32 channel digital Cadwell recorder reformatted into 16 channel montages with 1 devoted to EKG.  The 10 /20 international system electrode placement was used. Recording was done during awake, drowsiness and sleep states. Recording time 41.4 minutes.   Description of findings: Background rhythm consists of amplitude of  45 microvolt and frequency of 9-10 hertz posterior dominant rhythm. There was normal anterior posterior gradient noted. Background was well organized, continuous and symmetric with no focal slowing. There was muscle artifact noted. During drowsiness and sleep there was gradual decrease in background frequency noted. During the early stages of sleep there were symmetrical sleep spindles and vertex sharp waves and K complexes noted.  Hyperventilation resulted in slowing of the background activity. Photic stimulation using stepwise increase in photic frequency resulted in bilateral symmetric driving response. Throughout the recording there were no focal or generalized epileptiform activities in the form of spikes or sharps noted.  There was 1 brief generalized sharply contoured waves noted during sleep which was most likely part of sleep structure.  There were no transient rhythmic activities or electrographic seizures noted. One lead EKG rhythm strip revealed sinus rhythm at a rate of   70 bpm.  Impression: This EEG is normal during awake and sleep states. Please note that normal EEG does not exclude epilepsy, clinical correlation is indicated.     Keturah Shavers, MD

## 2023-11-08 NOTE — Progress Notes (Signed)
Patient: Jeremiah Hart MRN: [id number] Sex: male DOB: Mar 10, 2001  Provider: Keturah Shavers, MD Location of Care: [facility]  Note type: Routine return visit  Referral Source: pcp History from: patient, [facility] chart, and mother Chief Complaint: Seizures , EEG results   History of Present Illness: Jeremiah Hart is a 22 y.o. male is here for follow-up management of seizure disorder and discussing the EEG result. He has a diagnosis of generalized seizure disorder and most likely JME since 2017 with generalized discharges on his initial EEG, started on Keppra and then switched to Depakote due to behavioral and mood changes. He has not had any clinical seizure activity since 2018 but his EEGs showed some abnormality with occasional brief generalized discharges with his last EEG in September 2022 showed sharply contoured waves either generalized or frontocentral. He underwent an EEG prior to this visit today which did not show any epileptiform discharges or seizure activity except for 1 brief generalized sharply contoured waves during sleep which was most likely part of generalized vertex waves and K complexes. Since his last visit in January he has been doing very well without having any clinical seizure activity and as mentioned he has been seizure-free for about 6 years. He does have some stress and anxiety issues and some sleep difficulty for which he has been on some other medications including low-dose clonazepam, Lexapro and hydroxyzine. He has not had any blood work since 2021 when the level of Depakote was 88.   Review of Systems: Review of system as per HPI, otherwise negative.  Past Medical History:  Diagnosis Date   ADHD (attention deficit hyperactivity disorder)    Attention deficit disorder (ADD)    Depression    Seizures (HCC)    Hospitalizations: No., Head Injury: No., Nervous System Infections: No., Immunizations up to date: Yes.     Surgical  History Past Surgical History:  Procedure Laterality Date   APPENDECTOMY     APPENDECTOMY  10/09/2011   Procedure: APPENDECTOMY PEDIATRIC;  Surgeon: Judie Petit. Leonia Corona, MD;  Location: [facility];  Service: Pediatrics;  Laterality: N/A;    Family History family history includes Birth defects in his paternal grandfather; COPD in his paternal grandfather; Hypertension in his father and paternal grandfather; Migraines in his paternal aunt and sister; Vision loss in his paternal grandmother.   Social History Social History   Socioeconomic History   Marital status: Single    Spouse name: Not on file   Number of children: Not on file   Years of education: Not on file   Highest education level: Not on file  Occupational History   Not on file  Tobacco Use   Smoking status: Never    Passive exposure: Never   Smokeless tobacco: Never  Substance and Sexual Activity   Alcohol use: No   Drug use: No   Sexual activity: Never  Other Topics Concern   Not on file  Social History Narrative   Lindsey works    . He lives at home with mom, dad and sister.    Social Drivers of Health   Financial Resource Strain: Unknown (08/24/2021)   Received from [facility], [facility]   Overall Financial Resource Strain (CARDIA)    Difficulty of Paying Living Expenses: Patient declined  Food Insecurity: No Food Insecurity (08/24/2021)   Received from [facility], [facility]   Hunger Vital Sign    Worried About Running Out of Food in the Last Year: Never true  Ran Out of Food in the Last Year: Never true  Transportation Needs: No Transportation Needs (08/24/2021)   Received from [facility], [facility]   PRAPARE - Transportation    Lack of Transportation (Medical): No    Lack of Transportation (Non-Medical): No  Physical Activity: Inactive (08/24/2021)   Received from [facility], [facility]   Exercise Vital Sign    Days of Exercise per Week: 0 days    Minutes of Exercise per Session: 0  min  Stress: Stress Concern Present (08/24/2021)   Received from [facility], [facility] - Occupational Stress Questionnaire    Feeling of Stress : Very much  Social Connections: Unknown (03/22/2022)   Received from [facility], [facility]   Social Network    Social Network: Not on file     No Known Allergies  Physical Exam BP 112/60   Pulse 64   Ht 5' 4.33" (1.634 m)   Wt 154 lb 8.7 oz (70.1 kg)   BMI 26.25 kg/m  Gen: Awake, alert, not in distress Skin: No rash, No neurocutaneous stigmata. HEENT: Normocephalic, no dysmorphic features, no conjunctival injection, nares patent, mucous membranes moist, oropharynx clear. Neck: Supple, no meningismus. No focal tenderness. Resp: Clear to auscultation bilaterally CV: Regular rate, normal S1/S2, no murmurs, no rubs Abd: BS present, abdomen soft, non-tender, non-distended. No hepatosplenomegaly or mass Ext: Warm and well-perfused. No deformities, no muscle wasting, ROM full.  Neurological Examination: MS: Awake, alert, interactive. Normal eye contact, answered the questions appropriately, speech was fluent,  Normal comprehension.  Attention and concentration were normal. Cranial Nerves: Pupils were equal and reactive to light ( 5-95mm);  normal fundoscopic exam with sharp discs, visual field full with confrontation test; EOM normal, no nystagmus; no ptsosis, no double vision, intact facial sensation, face symmetric with full strength of facial muscles, hearing intact to finger rub bilaterally, palate elevation is symmetric, tongue protrusion is symmetric with full movement to both sides.  Sternocleidomastoid and trapezius are with normal strength. Tone-Normal Strength-Normal strength in all muscle groups DTRs-  Biceps Triceps Brachioradialis Patellar Ankle  R 2+ 2+ 2+ 2+ 2+  L 2+ 2+ 2+ 2+ 2+   Plantar responses flexor bilaterally, no clonus noted Sensation: Intact to light touch,  temperature, vibration, Romberg negative. Coordination: No dysmetria on FTN test. No difficulty with balance. Gait: Normal walk and run. Tandem gait was normal. Was able to perform toe walking and heel walking without difficulty.   Assessment and Plan 1. Nonintractable juvenile myoclonic epilepsy without status epilepticus (HCC)   2. Seizures (HCC)   3. Depressed mood    This is a 64-1/2-year-old male with diagnosis of juvenile myoclonic epilepsy since 2017, currently on moderate dose of Depakote with good seizure control and no clinical seizure activity since 2018.  He is also having some anxiety and depressed mood.  He has no focal findings on his neurological examination.  His EEG today is fairly normal. We discussed regarding the options of continuing medication or try him off of medication since he has not had any seizure for a few years although the chance of seizure would be more than 50% and for that reason patient and his mother is going to continue medication for now. Recommendations: Continue Depakote at the same dose of 1000 mg daily Continue with adequate sleep and limited screen time I will send a prescription for Nayzilam as a rescue medication in case of prolonged seizure activity I also sent  some blood work to be done over the next few weeks to check the trough level of Depakote as well as CBC and CMP I also placed a referral for transfer of care to adult neurology which he agrees I do not make a follow-up appointment but I will be available for any question or concerns until he would be seen by adult neurology in the next several months.  He and his mother understood and agreed with the plan.  Meds ordered this encounter  Medications   divalproex (DEPAKOTE ER) 500 MG 24 hr tablet    Sig: Take 2 tablets (1,000 mg total) by mouth at bedtime.    Dispense:  180 tablet    Refill:  2   Orders Placed This Encounter  Procedures   CBC with Differential/Platelet   Valproic acid  level   Comprehensive metabolic panel   Lipase   Ambulatory referral to Neurology    Referral Priority:   Routine    Referral Type:   Consultation    Referral Reason:   Specialty Services Required    Requested Specialty:   Neurology    Number of Visits Requested:   1

## 2023-11-08 NOTE — Patient Instructions (Signed)
Your EEG is normal today We will continue the same dose of Depakote at 2 tablets every night Continue with adequate sleep and limited screen time I will send a prescription for Nayzilam as a rescue medication in case of prolonged seizure activity We will schedule for blood work to check the Depakote level as well as CBC and CMP I will place a referral to adult neurology Call my office at any time if there is any concern prior to being seen by adult neurology

## 2023-11-08 NOTE — Progress Notes (Signed)
EEG complete - results pending 

## 2023-12-14 ENCOUNTER — Encounter (INDEPENDENT_AMBULATORY_CARE_PROVIDER_SITE_OTHER): Payer: Self-pay

## 2023-12-29 LAB — CBC WITH DIFFERENTIAL/PLATELET
Absolute Lymphocytes: 2290 {cells}/uL (ref 850–3900)
Absolute Monocytes: 588 {cells}/uL (ref 200–950)
Basophils Absolute: 39 {cells}/uL (ref 0–200)
Basophils Relative: 0.7 %
Eosinophils Absolute: 39 {cells}/uL (ref 15–500)
Eosinophils Relative: 0.7 %
HCT: 46.7 % (ref 38.5–50.0)
Hemoglobin: 15.5 g/dL (ref 13.2–17.1)
MCH: 30.5 pg (ref 27.0–33.0)
MCHC: 33.2 g/dL (ref 32.0–36.0)
MCV: 91.7 fL (ref 80.0–100.0)
MPV: 12.1 fL (ref 7.5–12.5)
Monocytes Relative: 10.5 %
Neutro Abs: 2643 {cells}/uL (ref 1500–7800)
Neutrophils Relative %: 47.2 %
Platelets: 209 10*3/uL (ref 140–400)
RBC: 5.09 10*6/uL (ref 4.20–5.80)
RDW: 12.5 % (ref 11.0–15.0)
Total Lymphocyte: 40.9 %
WBC: 5.6 10*3/uL (ref 3.8–10.8)

## 2023-12-29 LAB — COMPREHENSIVE METABOLIC PANEL
AG Ratio: 1.6 (calc) (ref 1.0–2.5)
ALT: 14 U/L (ref 9–46)
AST: 14 U/L (ref 10–40)
Albumin: 4.7 g/dL (ref 3.6–5.1)
Alkaline phosphatase (APISO): 70 U/L (ref 36–130)
BUN: 15 mg/dL (ref 7–25)
CO2: 29 mmol/L (ref 20–32)
Calcium: 9.8 mg/dL (ref 8.6–10.3)
Chloride: 105 mmol/L (ref 98–110)
Creat: 0.65 mg/dL (ref 0.60–1.24)
Globulin: 3 g/dL (ref 1.9–3.7)
Glucose, Bld: 92 mg/dL (ref 65–99)
Potassium: 4.4 mmol/L (ref 3.5–5.3)
Sodium: 142 mmol/L (ref 135–146)
Total Bilirubin: 0.4 mg/dL (ref 0.2–1.2)
Total Protein: 7.7 g/dL (ref 6.1–8.1)

## 2023-12-29 LAB — VALPROIC ACID LEVEL: Valproic Acid Lvl: 73.3 mg/L (ref 50.0–100.0)

## 2023-12-29 LAB — LIPASE: Lipase: 19 U/L (ref 7–60)

## 2024-03-19 ENCOUNTER — Encounter: Payer: Self-pay | Admitting: Neurology

## 2024-03-19 ENCOUNTER — Ambulatory Visit: Payer: BC Managed Care – PPO | Admitting: Neurology

## 2024-03-19 VITALS — BP 121/80 | HR 78 | Ht 65.0 in | Wt 157.0 lb

## 2024-03-19 DIAGNOSIS — G40309 Generalized idiopathic epilepsy and epileptic syndromes, not intractable, without status epilepticus: Secondary | ICD-10-CM | POA: Diagnosis not present

## 2024-03-19 MED ORDER — DIVALPROEX SODIUM ER 500 MG PO TB24: 1000.0000 mg | ORAL_TABLET | Freq: Every day | ORAL | 3 refills | Status: AC

## 2024-03-19 NOTE — Patient Instructions (Signed)
 Decrease Clonazepam to 1/4 tablet daily for 2 weeks  Then further to 1/4 tablet every other day until you run out of medications  Continue with Depakote  XR 1000 mg nightly  Continue your other medications  Follow up in a year or sooner if worse

## 2024-03-19 NOTE — Progress Notes (Signed)
 [facility]  PATIENT: Jeremiah Hart DOB: 02-Sep-2001  REQUESTING CLINICIAN: Ventura Gins, MD HISTORY FROM: Patient/ Chart review  REASON FOR VISIT: Generalized epilepsy    HISTORICAL  CHIEF COMPLAINT:  Chief Complaint  Patient presents with   New Patient (Initial Visit)    Rm13, alone, Sz: last episode yrs ago, well controlled would like to decrease some meds    HISTORY OF PRESENT ILLNESS:  This is a 23 year old gentleman with past medical history of generalized epilepsy who is presenting for management of his epilepsy.  Patient was previously managed by the peds neurology team but now needs an adult neurologist.  He tells me that his first seizure started around 2017, at that time he was diagnosed with generalized epilepsy after having EEG which was abnormal (Generalized discharges) and started on levetiracetam .  His seizures are described as myoclonic jerks.  He denies having a full generalized convulsion.  Since being on levetiracetam , he did have mood swing, therefore switched to Depakote  extended release 1000 mg nightly.  He has not had any myoclonic jerks since starting antiseizure medication.  He is also on Klonopin, low-dose 0.5 mg daily and he would like to come off the medication.  No other complaints, no other concerns.  Handedness: Right handed   Onset:  2017  Seizure Type:  Myoclonic jerk   Current frequency: has not had a jerk since starting Antiseizure medication   Any injuries from seizures: Dnies   Seizure risk factors: None   Previous ASMs: Levetiracetam    Currenty ASMs: Valproic acid  ER 1000 mg nightly   ASMs side effects: Denies   Brain Images: Not available for review   Previous EEGs: Generalized discharges (08/2016 ), Normal EEG (10/2023)   OTHER MEDICAL CONDITIONS: epilepsy, anxiety   REVIEW OF SYSTEMS: Full 14 system review of systems performed and negative with exception of: As noted in the HPI   ALLERGIES: No  Known Allergies  HOME MEDICATIONS: Outpatient Medications Prior to Visit  Medication Sig Dispense Refill   escitalopram  (LEXAPRO ) 20 MG tablet Take 20 mg by mouth daily.     hydrOXYzine (ATARAX) 50 MG tablet TAKE 1 TO 2 TABLETS BY MOUTH AT BEDTIME AS NEEDED FOR ANXIETY/SLEEPING DIFFICULTY     clonazePAM (KLONOPIN) 0.5 MG tablet Take by mouth.     divalproex  (DEPAKOTE  ER) 500 MG 24 hr tablet Take 2 tablets (1,000 mg total) by mouth at bedtime. 180 tablet 2   No facility-administered medications prior to visit.    PAST MEDICAL HISTORY: Past Medical History:  Diagnosis Date   ADHD (attention deficit hyperactivity disorder)    Attention deficit disorder (ADD)    Depression    Seizures (HCC)     PAST SURGICAL HISTORY: Past Surgical History:  Procedure Laterality Date   APPENDECTOMY     APPENDECTOMY  10/09/2011   Procedure: APPENDECTOMY PEDIATRIC;  Surgeon: Melven Stable. Alanda Allegra, MD;  Location: [facility] OR;  Service: Pediatrics;  Laterality: N/A;    FAMILY HISTORY: Family History  Problem Relation Age of Onset   Hypertension Father    Vision loss Paternal Grandmother        Macular Degeneration   Birth defects Paternal Grandfather        Missing some of fingers and toes   COPD Paternal Grandfather    Hypertension Paternal Grandfather    Migraines Sister    Migraines Paternal Aunt     SOCIAL HISTORY: Social History   Socioeconomic History   Marital status: Single  Spouse name: Not on file   Number of children: 0   Years of education: Not on file   Highest education level: Bachelor's degree (e.g., BA, AB, BS)  Occupational History   Not on file  Tobacco Use   Smoking status: Never    Passive exposure: Never   Smokeless tobacco: Never  Vaping Use   Vaping status: Never Used  Substance and Sexual Activity   Alcohol use: No   Drug use: No   Sexual activity: Yes    Birth control/protection: Condom  Other Topics Concern   Not on file  Social History Narrative   Jeremiah Hart  works    . He lives at home with mom, dad and sister.    Social Drivers of Corporate investment banker Strain: Low Risk  (12/24/2023)   Received from [facility]   Overall Financial Resource Strain (CARDIA)    Difficulty of Paying Living Expenses: Not hard at all  Food Insecurity: No Food Insecurity (12/24/2023)   Received from [facility]   Hunger Vital Sign    Worried About Running Out of Food in the Last Year: Never true    Ran Out of Food in the Last Year: Never true  Transportation Needs: No Transportation Needs (12/24/2023)   Received from [facility] - Transportation    Lack of Transportation (Medical): No    Lack of Transportation (Non-Medical): No  Physical Activity: Unknown (12/24/2023)   Received from [facility]   Exercise Vital Sign    Days of Exercise per Week: 0 days    Minutes of Exercise per Session: Not on file  Stress: No Stress Concern Present (12/24/2023)   Received from [facility] - Occupational Stress Questionnaire    Feeling of Stress : Not at all  Social Connections: Socially Integrated (12/24/2023)   Received from [facility]   Social Network    How would you rate your social network (family, work, friends)?: Good participation with social networks  Intimate Partner Violence: Not At Risk (12/24/2023)   Received from [facility]   HITS    Over the last 12 months how often did your partner physically hurt you?: Never    Over the last 12 months how often did your partner insult you or talk down to you?: Never    Over the last 12 months how often did your partner threaten you with physical harm?: Never    Over the last 12 months how often did your partner scream or curse at you?: Never   PHYSICAL EXAM  GENERAL EXAM/CONSTITUTIONAL: Vitals:  Vitals:   03/19/24 1509  BP: 121/80  Pulse: 78  Weight: 157 lb (71.2 kg)  Height: 5\' 5"  (1.651 m)   Body mass index is 26.13 kg/m. Wt Readings from Last  3 Encounters:  03/19/24 157 lb (71.2 kg)  11/08/23 154 lb 8.7 oz (70.1 kg)  12/11/21 137 lb 12.6 oz (62.5 kg)   Patient is in no distress; well developed, nourished and groomed; neck is supple  MUSCULOSKELETAL: Gait, strength, tone, movements noted in Neurologic exam below  NEUROLOGIC: MENTAL STATUS:      No data to display         awake, alert, oriented to person, place and time recent and remote memory intact normal attention and concentration language fluent, comprehension intact, naming intact fund of knowledge appropriate  CRANIAL NERVE:  2nd, 3rd, 4th, 6th - Visual fields full to confrontation, extraocular  muscles intact, no nystagmus 5th - facial sensation symmetric 7th - facial strength symmetric 8th - hearing intact 9th - palate elevates symmetrically, uvula midline 11th - shoulder shrug symmetric 12th - tongue protrusion midline  MOTOR:  normal bulk and tone, full strength in the BUE, BLE  SENSORY:  normal and symmetric to light touch  COORDINATION:  finger-nose-finger, fine finger movements normal  GAIT/STATION:  normal    DIAGNOSTIC DATA (LABS, IMAGING, TESTING) - I reviewed patient records, labs, notes, testing and imaging myself where available.  Lab Results  Component Value Date   WBC 5.6 12/28/2023   HGB 15.5 12/28/2023   HCT 46.7 12/28/2023   MCV 91.7 12/28/2023   PLT 209 12/28/2023      Component Value Date/Time   NA 142 12/28/2023 1101   K 4.4 12/28/2023 1101   CL 105 12/28/2023 1101   CO2 29 12/28/2023 1101   GLUCOSE 92 12/28/2023 1101   BUN 15 12/28/2023 1101   CREATININE 0.65 12/28/2023 1101   CALCIUM 9.8 12/28/2023 1101   PROT 7.7 12/28/2023 1101   ALBUMIN 4.2 10/09/2011 1313   AST 14 12/28/2023 1101   ALT 14 12/28/2023 1101   ALKPHOS 267 10/09/2011 1313   BILITOT 0.4 12/28/2023 1101   GFRNONAA NOT CALCULATED 10/09/2011 1313   GFRAA NOT CALCULATED 10/09/2011 1313   No results found for: "CHOL", "HDL", "LDLCALC",  "LDLDIRECT", "TRIG" No results found for: "HGBA1C" No results found for: "VITAMINB12" Lab Results  Component Value Date   TSH 2.03 03/01/2018   Depakote  level 12/28/2023:  73.3  Sleep deprived EEG 11/08/2023 Normal    I personally reviewed brain Images and previous EEG reports.   ASSESSMENT AND PLAN  23 y.o. year old male  with history of generalized epilepsy, anxiety who is presenting for management of his epilepsy.  He did extremely well with his antiseizure medication, currently on Depakote  extended release 1000 mg nightly, will continue patient on current medication.  For his Klonopin, we will reduce it to a quarter tablet every morning for 2 weeks then patient can take 1/4 tablet every other day until he runs out of KLlnopin.  He voiced understanding.  Continue your other medications, and I will see him in 1 year for follow-up or sooner if worse.  Advised patient to contact me if he does have any breakthrough seizure.   1. Generalized seizure disorder [facility])     Patient Instructions  Decrease Clonazepam to 1/4 tablet daily for 2 weeks  Then further to 1/4 tablet every other day until you run out of medications  Continue with Depakote  XR 1000 mg nightly  Continue your other medications  Follow up in a year or sooner if worse    Per [place]  DMV statutes, patients with seizures are not allowed to drive until they have been seizure-free for six months.  Other recommendations include using caution when using heavy equipment or power tools. Avoid working on ladders or at heights. Take showers instead of baths.  Do not swim alone.  Ensure the water temperature is not too high on the home water heater. Do not go swimming alone. Do not lock yourself in a room alone (i.e. bathroom). When caring for infants or small children, sit down when holding, feeding, or changing them to minimize risk of injury to the child in the event you have a seizure. Maintain good sleep hygiene. Avoid  alcohol.  Also recommend adequate sleep, hydration, good diet and minimize stress.   During the Seizure  -  First, ensure adequate ventilation and place patients on the floor on their left side  Loosen clothing around the neck and ensure the airway is patent. If the patient is clenching the teeth, do not force the mouth open with any object as this can cause severe damage - Remove all items from the surrounding that can be hazardous. The patient may be oblivious to what's happening and may not even know what he or she is doing. If the patient is confused and wandering, either gently guide him/her away and block access to outside areas - Reassure the individual and be comforting - Call 911. In most cases, the seizure ends before EMS arrives. However, there are cases when seizures may last over 3 to 5 minutes. Or the individual may have developed breathing difficulties or severe injuries. If a pregnant patient or a person with diabetes develops a seizure, it is prudent to call an ambulance. - Finally, if the patient does not regain full consciousness, then call EMS. Most patients will remain confused for about 45 to 90 minutes after a seizure, so you must use judgment in calling for help. - Avoid restraints but make sure the patient is in a bed with padded side rails - Place the individual in a lateral position with the neck slightly flexed; this will help the saliva drain from the mouth and prevent the tongue from falling backward - Remove all nearby furniture and other hazards from the area - Provide verbal assurance as the individual is regaining consciousness - Provide the patient with privacy if possible - Call for help and start treatment as ordered by the caregiver   After the Seizure (Postictal Stage)  After a seizure, most patients experience confusion, fatigue, muscle pain and/or a headache. Thus, one should permit the individual to sleep. For the next few days, reassurance is essential.  Being calm and helping reorient the person is also of importance.  Most seizures are painless and end spontaneously. Seizures are not harmful to others but can lead to complications such as stress on the lungs, brain and the heart. Individuals with prior lung problems may develop labored breathing and respiratory distress.    Discussed Patients with epilepsy have a small risk of sudden unexpected death, a condition referred to as sudden unexpected death in epilepsy (SUDEP). SUDEP is defined specifically as the sudden, unexpected, witnessed or unwitnessed, nontraumatic and nondrowning death in patients with epilepsy with or without evidence for a seizure, and excluding documented status epilepticus, in which post mortem examination does not reveal a structural or toxicologic cause for death     No orders of the defined types were placed in this encounter.   Meds ordered this encounter  Medications   divalproex  (DEPAKOTE  ER) 500 MG 24 hr tablet    Sig: Take 2 tablets (1,000 mg total) by mouth at bedtime.    Dispense:  180 tablet    Refill:  3    Return in about 1 year (around 03/19/2025).    Cassandra Cleveland, MD 03/19/2024, 3:46 PM  [facility] [address] [place], [place] [address] [phone]

## 2025-03-19 ENCOUNTER — Ambulatory Visit: Admitting: Neurology
